# Patient Record
Sex: Male | Born: 1990 | Race: Black or African American | Hispanic: No | Marital: Single | Smoking: Former smoker
Health system: Southern US, Community
[De-identification: ages and names within clinical notes are randomized; demographics above are authoritative.]

## PROBLEM LIST (undated history)

## (undated) HISTORY — PX: COSMETIC SURGERY: SHX468

## (undated) HISTORY — PX: DENTAL SURGERY: SHX609

---
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
## 2014-02-25 ENCOUNTER — Emergency Department (HOSPITAL_COMMUNITY): Payer: Self-pay

## 2014-02-25 ENCOUNTER — Emergency Department (HOSPITAL_COMMUNITY)
Admission: EM | Admit: 2014-02-25 | Discharge: 2014-02-25 | Disposition: A | Discharge status: Home / Self Care | Payer: Self-pay | Attending: Emergency Medicine | Admitting: Emergency Medicine

## 2014-02-25 ENCOUNTER — Encounter (HOSPITAL_COMMUNITY): Payer: Self-pay | Admitting: Emergency Medicine

## 2014-02-25 DIAGNOSIS — Y9389 Activity, other specified: Secondary | ICD-10-CM | POA: Insufficient documentation

## 2014-02-25 DIAGNOSIS — Y92410 Unspecified street and highway as the place of occurrence of the external cause: Secondary | ICD-10-CM | POA: Insufficient documentation

## 2014-02-25 DIAGNOSIS — M545 Low back pain, unspecified: Secondary | ICD-10-CM

## 2014-02-25 DIAGNOSIS — S298XXA Other specified injuries of thorax, initial encounter: Secondary | ICD-10-CM | POA: Insufficient documentation

## 2014-02-25 DIAGNOSIS — F17200 Nicotine dependence, unspecified, uncomplicated: Secondary | ICD-10-CM | POA: Insufficient documentation

## 2014-02-25 DIAGNOSIS — IMO0002 Reserved for concepts with insufficient information to code with codable children: Secondary | ICD-10-CM | POA: Insufficient documentation

## 2014-02-25 DIAGNOSIS — M62830 Muscle spasm of back: Secondary | ICD-10-CM

## 2014-02-25 MED ORDER — CYCLOBENZAPRINE HCL 10 MG PO TABS: 10.0000 mg | ORAL_TABLET | Freq: Two times a day (BID) | ORAL | Status: DC | PRN

## 2014-02-25 MED ORDER — IBUPROFEN 800 MG PO TABS: 800.0000 mg | ORAL_TABLET | Freq: Three times a day (TID) | ORAL | Status: DC

## 2014-02-25 MED ORDER — IBUPROFEN 800 MG PO TABS
800.0000 mg | ORAL_TABLET | Freq: Once | ORAL | Status: AC
  Administered 2014-02-25: 800 mg via ORAL
  Filled 2014-02-25: qty 1
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
## 2014-04-03 NOTE — ED Notes (Signed)
Medical screening examination/treatment/procedure(s) were performed by non-physician practitioner and as supervising physician I was immediately available for consultation/collaboration.   EKG Interpretation None       Arby BarretteMarcy Makeba Delcastillo, MD 04/03/14 [phone]

## 2014-04-03 NOTE — ED Notes (Signed)
Medical screening examination/treatment/procedure(s) were performed by non-physician practitioner and as supervising physician I was immediately available for consultation/collaboration.   EKG Interpretation None       Arby BarretteMarcy Vadis Slabach, MD 04/03/14 [phone]

## 2016-12-24 ENCOUNTER — Emergency Department (HOSPITAL_COMMUNITY): Payer: BLUE CROSS/BLUE SHIELD

## 2016-12-24 ENCOUNTER — Emergency Department (HOSPITAL_COMMUNITY)
Admission: EM | Admit: 2016-12-24 | Discharge: 2016-12-24 | Disposition: A | Discharge status: Home / Self Care | Payer: BLUE CROSS/BLUE SHIELD | Attending: Emergency Medicine | Admitting: Emergency Medicine

## 2016-12-24 ENCOUNTER — Encounter (HOSPITAL_COMMUNITY): Payer: Self-pay | Admitting: Emergency Medicine

## 2016-12-24 DIAGNOSIS — R079 Chest pain, unspecified: Secondary | ICD-10-CM | POA: Diagnosis present

## 2016-12-24 DIAGNOSIS — F17210 Nicotine dependence, cigarettes, uncomplicated: Secondary | ICD-10-CM | POA: Diagnosis not present

## 2016-12-24 DIAGNOSIS — J9311 Primary spontaneous pneumothorax: Secondary | ICD-10-CM | POA: Diagnosis not present

## 2016-12-24 DIAGNOSIS — J939 Pneumothorax, unspecified: Secondary | ICD-10-CM

## 2016-12-24 LAB — CBC
HEMATOCRIT: 40.6 % (ref 39.0–52.0)
HEMOGLOBIN: 13.6 g/dL (ref 13.0–17.0)
MCH: 28 pg (ref 26.0–34.0)
MCHC: 33.5 g/dL (ref 30.0–36.0)
MCV: 83.7 fL (ref 78.0–100.0)
Platelets: 216 10*3/uL (ref 150–400)
RBC: 4.85 MIL/uL (ref 4.22–5.81)
RDW: 13.5 % (ref 11.5–15.5)
WBC: 8 10*3/uL (ref 4.0–10.5)

## 2016-12-24 LAB — I-STAT TROPONIN, ED: Troponin i, poc: 0.01 ng/mL (ref 0.00–0.08)

## 2016-12-24 LAB — BASIC METABOLIC PANEL
ANION GAP: 5 (ref 5–15)
BUN: 10 mg/dL (ref 6–20)
CALCIUM: 9.1 mg/dL (ref 8.9–10.3)
CHLORIDE: 107 mmol/L (ref 101–111)
CO2: 28 mmol/L (ref 22–32)
Creatinine, Ser: 0.94 mg/dL (ref 0.61–1.24)
GFR calc Af Amer: >60 mL/min (ref >60)
GFR calc non Af Amer: >60 mL/min (ref >60)
GLUCOSE: 92 mg/dL (ref 65–99)
POTASSIUM: 3.9 mmol/L (ref 3.5–5.1)
Sodium: 140 mmol/L (ref 135–145)

## 2016-12-24 NOTE — Consult Note (Signed)
Name: Martin Murray MRN: [id number] DOB: Jun 19, 1990    ADMISSION DATE:  12/24/2016 CONSULTATION DATE:  12/24/16  REFERRING MD :  Dr. 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    The patient reports he was recently playing basketball and set up for a screen when he was hit hard on the left side by another player.  Since that time, he has been hurting in his left chest.  He went to an urgent care 7/9 and was referred to the ER for symptoms.    Initial ER evaluation included a CXR which showed a small (less than 10%) pneumothorax.  He was treated with 100% NRB and observed in the ER. The patient reports improvement in symptoms with O2 administration.  Currently, he denies chest pain and SOB.  He states he has a follow up appointment scheduled tomorrow at 3pm with [facility].      PAST MEDICAL HISTORY :   has no past medical history on file.     has a past surgical history that includes Cosmetic surgery.  Prior to Admission medications   Medication Sig Start Date End Date Taking? Authorizing Provider  acetaminophen (TYLENOL) 500 MG tablet Take 1,000 mg by mouth every 6 (six) hours as needed for mild pain, moderate pain, fever or headache.   Yes [provider]  diphenhydramine-acetaminophen (TYLENOL PM) 25-500 MG TABS tablet Take 2 tablets by mouth at bedtime as needed (for sleep).   Yes [provider]    No Known Allergies  FAMILY HISTORY:  family history is not on file.  SOCIAL HISTORY:  reports that he has been smoking Cigarettes.  He has never used smokeless tobacco. He reports that he uses drugs, including Marijuana. He reports that he does not drink alcohol.  REVIEW OF SYSTEMS:  POSITIVES IN BOLD Constitutional: Negative for fever, chills, weight loss, malaise/fatigue and diaphoresis.  HENT: Negative for hearing loss, ear  pain, nosebleeds, congestion, sore throat, neck pain, tinnitus and ear discharge.   Eyes: Negative for blurred vision, double vision, photophobia, pain, discharge and redness.  Respiratory: Negative for cough, hemoptysis, sputum production, shortness of breath, wheezing and stridor.  Left sided pleuritic chest pain.  Cardiovascular: Negative for chest pain, palpitations, orthopnea, claudication, leg swelling and PND.  Gastrointestinal: Negative for heartburn, nausea, vomiting, abdominal pain, diarrhea, constipation, blood in stool and melena.  Genitourinary: Negative for dysuria, urgency, frequency, hematuria and flank pain.  Musculoskeletal: Negative for myalgias, back pain, joint pain and falls.  Skin: Negative for itching and rash.  Neurological: Negative for dizziness, tingling, tremors, sensory change, speech change, focal weakness, seizures, loss of consciousness, weakness and headaches.  Endo/Heme/Allergies: Negative for environmental allergies and polydipsia. Does not bruise/bleed easily.  SUBJECTIVE:   VITAL SIGNS: Pulse Rate:  [87] 87 (07/09 1116) Resp:  [18] 18 (07/09 1116) BP: (110)/(71) 110/71 (07/09 1116) SpO2:  [98 %] 98 % (07/09 1116) Weight:  [158 lb (71.7 kg)] 158 lb (71.7 kg) (07/09 1116)  PHYSICAL EXAMINATION: General: tall / thin young adult male sitting up in bed.  In good spirits / joking with staff.  HEENT: MM pink/moist, good dentition  PSY: calm/appropriate  Neuro: AAOx4, speech clear, MAE  CV: s1s2 rrr, no m/r/g PULM: even/non-labored, lungs bilaterally clear with good air movement  WU:JWJX, non-tender, bsx4 active  Extremities: warm/dry, no edema  Skin: no rashes or lesions on visible skin    Recent Labs Lab 12/24/16 1142  NA 140  K 3.9  CL 107  CO2 28  BUN 10  CREATININE 0.94  GLUCOSE 92     Recent Labs Lab 12/24/16 1142  HGB 13.6  HCT 40.6  WBC 8.0  PLT 216    Dg Chest 1 View  Result Date: 12/24/2016 CLINICAL DATA:  Left-sided  chest pain EXAM: CHEST 1 VIEW COMPARISON:  Earlier today FINDINGS: Confirmed small left apical pneumothorax, less than 10%. No convincing change from prior when allowing for differences in inspiration. No underlying lung opacity is seen. Normal heart size and mediastinal contours. No osseous findings. IMPRESSION: Confirmed left apical pneumothorax, less than 10%. Electronically Signed   By: Marnee SpringJonathon  Watts M.D.   On: 12/24/2016 13:27   Dg Chest 2 View  Result Date: 12/24/2016 CLINICAL DATA:  Chest pain with cough and chest congestion. EXAM: CHEST  2 VIEW COMPARISON:  02/25/2014 FINDINGS: There is suggestion of a tiny left apical pneumothorax. This is not definitive. I recommend a PA expiratory chest x-ray for further evaluation. The lungs are otherwise clear. Heart size and vascularity are normal. Bones are normal. No effusions. IMPRESSION: Possible tiny left apical pneumothorax. Expiratory PA chest x-ray recommended for further evaluation. Electronically Signed   By: Francene BoyersJames  Maxwell M.D.   On: 12/24/2016 12:20      SIGNIFICANT EVENTS  7/09  Seen in ER with spontaneous PTX  STUDIES:  CXR's as above    ASSESSMENT / PLAN:  Discussion: 26 y/o M seen on 7/9 with complaints of pleuritic left chest pain in the setting of a small (<10%) pneumothorax.  Potential recent trauma while playing basketball.  No family hx of hereditary pulmonary diseases.  Symptoms improved with administration of 100% NRB.  No change in follow / repeat film.     Left Pneumothorax   Plan: Agree that patient can discharge home with follow up with [facility] as planned for CXR at 3pm 7/10 Reviewed and contracted with patient that he would return to the ER immediately if new or worsening symptoms to include - chest pain, shortness of breath, pain with inspiration, pre-syncope / syncope Also, asked patient to stay with family overnight or have someone come stay with him Would encourage limited strenuous physical activity until  repeat CXR on 7/10 Pt verbalizes understanding of above.   Discussed with ER attending MD   Canary BrimBrandi Ladell Bey, NP-C [facility] Pgr: [phone] or if no answer [phone] 12/24/2016, 2:44 PM

## 2016-12-24 NOTE — Discharge Instructions (Signed)
Follow-up with no Martin Murray tomorrow as planned. Return sooner for increasing shortness of breath or difficulty breathing.

## 2016-12-24 NOTE — ED Provider Notes (Signed)
[facility]-EMERGENCY DEPT Provider Note   CSN: [id number] Arrival date & time: 12/24/16  1112     History   Chief Complaint Chief Complaint  Patient presents with  . Chest Pain    HPI Martin Murray is a 26 y.o. male.  HPI Patient resents with chest pain. Began yesterday while playing [employer]. Dull in his mid chest. Worse with certain movements. Worse with lying back somewhat better sitting up. Has some shortness of breath. States it comes and goes. No fevers. No swelling in his legs. Was seen at an urgent care and told him it may be muscle skeletal metastases and so severe she come to the ER. Father does have heart failure but does not know when it started. History reviewed. No pertinent past medical history.  There are no active problems to display for this patient.   Past Surgical History:  Procedure Laterality Date  . COSMETIC SURGERY     chin       Home Medications    Prior to Admission medications   Medication Sig Start Date End Date Taking? Authorizing Provider  acetaminophen (TYLENOL) 500 MG tablet Take 1,000 mg by mouth every 6 (six) hours as needed for mild pain, moderate pain, fever or headache.   Yes [provider]  diphenhydramine-acetaminophen (TYLENOL PM) 25-500 MG TABS tablet Take 2 tablets by mouth at bedtime as needed (for sleep).   Yes [provider]    Family History No family history on file.  Social History Social History  Substance Use Topics  . Smoking status: Current Every Day Smoker    Types: Cigarettes  . Smokeless tobacco: Never Used  . Alcohol use No     Allergies   Patient has no known allergies.   Review of Systems Review of Systems  Constitutional: Negative for appetite change.  HENT: Negative for congestion.   Respiratory: Positive for shortness of breath.   Cardiovascular: Positive for chest pain. Negative for leg swelling.  Gastrointestinal: Negative for abdominal pain.  Genitourinary: Negative for  flank pain.  Musculoskeletal: Negative for back pain.  Neurological: Negative for seizures.  Psychiatric/Behavioral: Negative for confusion.     Physical Exam Updated Vital Signs BP 122/69   Pulse 84   Temp 97.7 F (36.5 C)   Resp 16   Ht 6\' 2"  (1.88 m)   Wt 71.7 kg (158 lb)   SpO2 98%   BMI 20.29 kg/m   Physical Exam  Constitutional: He appears well-developed.  HENT:  Head: Normocephalic.  Eyes: Pupils are equal, round, and reactive to light.  Neck: Neck supple.  Cardiovascular: Exam reveals no friction rub.   No murmur heard. Tenderness to anterior lower chest wall. No rash. No crepitance or deformity.  Pulmonary/Chest: Effort normal.  Abdominal: Soft.  Musculoskeletal: He exhibits no edema.  Neurological: He is alert.  Skin: Skin is warm.     ED Treatments / Results  Labs (all labs ordered are listed, but only abnormal results are displayed) Labs Reviewed  BASIC METABOLIC PANEL  CBC  I-STAT TROPOININ, ED    EKG  EKG Interpretation  Date/Time:  Monday December 24 2016 11:20:15 EDT Ventricular Rate:  80 PR Interval:    QRS Duration: 91 QT Interval:  363 QTC Calculation: 419 R Axis:   85 Text Interpretation:  Sinus rhythm Right atrial enlargement RSR' in V1 or V2, probably normal variant Probable left ventricular hypertrophy Confirmed by Rubin Payor  MD, Harrold Donath [id number]) on 12/24/2016 12:38:55 PM Also confirmed by Rubin Payor  MD, Harrold DonathNATHAN [phone]), editor Misty StanleyScales-Price, Shannon [phone])  on 12/24/2016 1:04:29 PM       Radiology Dg Chest 1 View  Result Date: 12/24/2016 CLINICAL DATA:  Left-sided chest pain EXAM: CHEST 1 VIEW COMPARISON:  Earlier today FINDINGS: Confirmed small left apical pneumothorax, less than 10%. No convincing change from prior when allowing for differences in inspiration. No underlying lung opacity is seen. Normal heart size and mediastinal contours. No osseous findings. IMPRESSION: Confirmed left apical pneumothorax, less than 10%. Electronically  Signed   By: Marnee SpringJonathon  Watts M.D.   On: 12/24/2016 13:27   Dg Chest 2 View  Result Date: 12/24/2016 CLINICAL DATA:  Chest pain with cough and chest congestion. EXAM: CHEST  2 VIEW COMPARISON:  02/25/2014 FINDINGS: There is suggestion of a tiny left apical pneumothorax. This is not definitive. I recommend a PA expiratory chest x-ray for further evaluation. The lungs are otherwise clear. Heart size and vascularity are normal. Bones are normal. No effusions. IMPRESSION: Possible tiny left apical pneumothorax. Expiratory PA chest x-ray recommended for further evaluation. Electronically Signed   By: Francene BoyersJames  Maxwell M.D.   On: 12/24/2016 12:20    Procedures Procedures (including critical care time)  Medications Ordered in ED Medications - No data to display   Initial Impression / Assessment and Plan / ED Course  I have reviewed the triage vital signs and the nursing notes.  Pertinent labs & imaging results that were available during my care of the patient were reviewed by me and considered in my medical decision making (see chart for details).     Patient with acute shortness of breath. Began yesterday. Found to have small pneumothorax. Seen by pulmonary. Has had 2 hours of high flow oxygen. Stable restaurant status. Has follow-up with no Benjamine MolaVann tomorrow. Will discharge home.  Final Clinical Impressions(s) / ED Diagnoses   Final diagnoses:  Primary spontaneous pneumothorax    New Prescriptions New Prescriptions   No medications on file     Benjiman CorePickering, Demica Zook, MD 12/24/16 [id number]

## 2016-12-24 NOTE — ED Triage Notes (Signed)
patietn c/o left sided chest pain that has been hurting for coupe days. Patient went to PCP this morning and was told sounded muscular but went home and sharps pains were intermittent again so PCP when called told to go to ED. Patient reports feels like really bad heart burn. Patient reports when he was playing basketball yesterday he shot and pain started then couldn't play anymore.

## 2017-11-12 IMAGING — CR DG CHEST 2V
2 series · 2 of 2 positions shown · non-contrast
Comparison: 02/25/2014

CLINICAL DATA: Chest pain with cough and chest congestion.

EXAM:
CHEST  2 VIEW

[w chest pa]
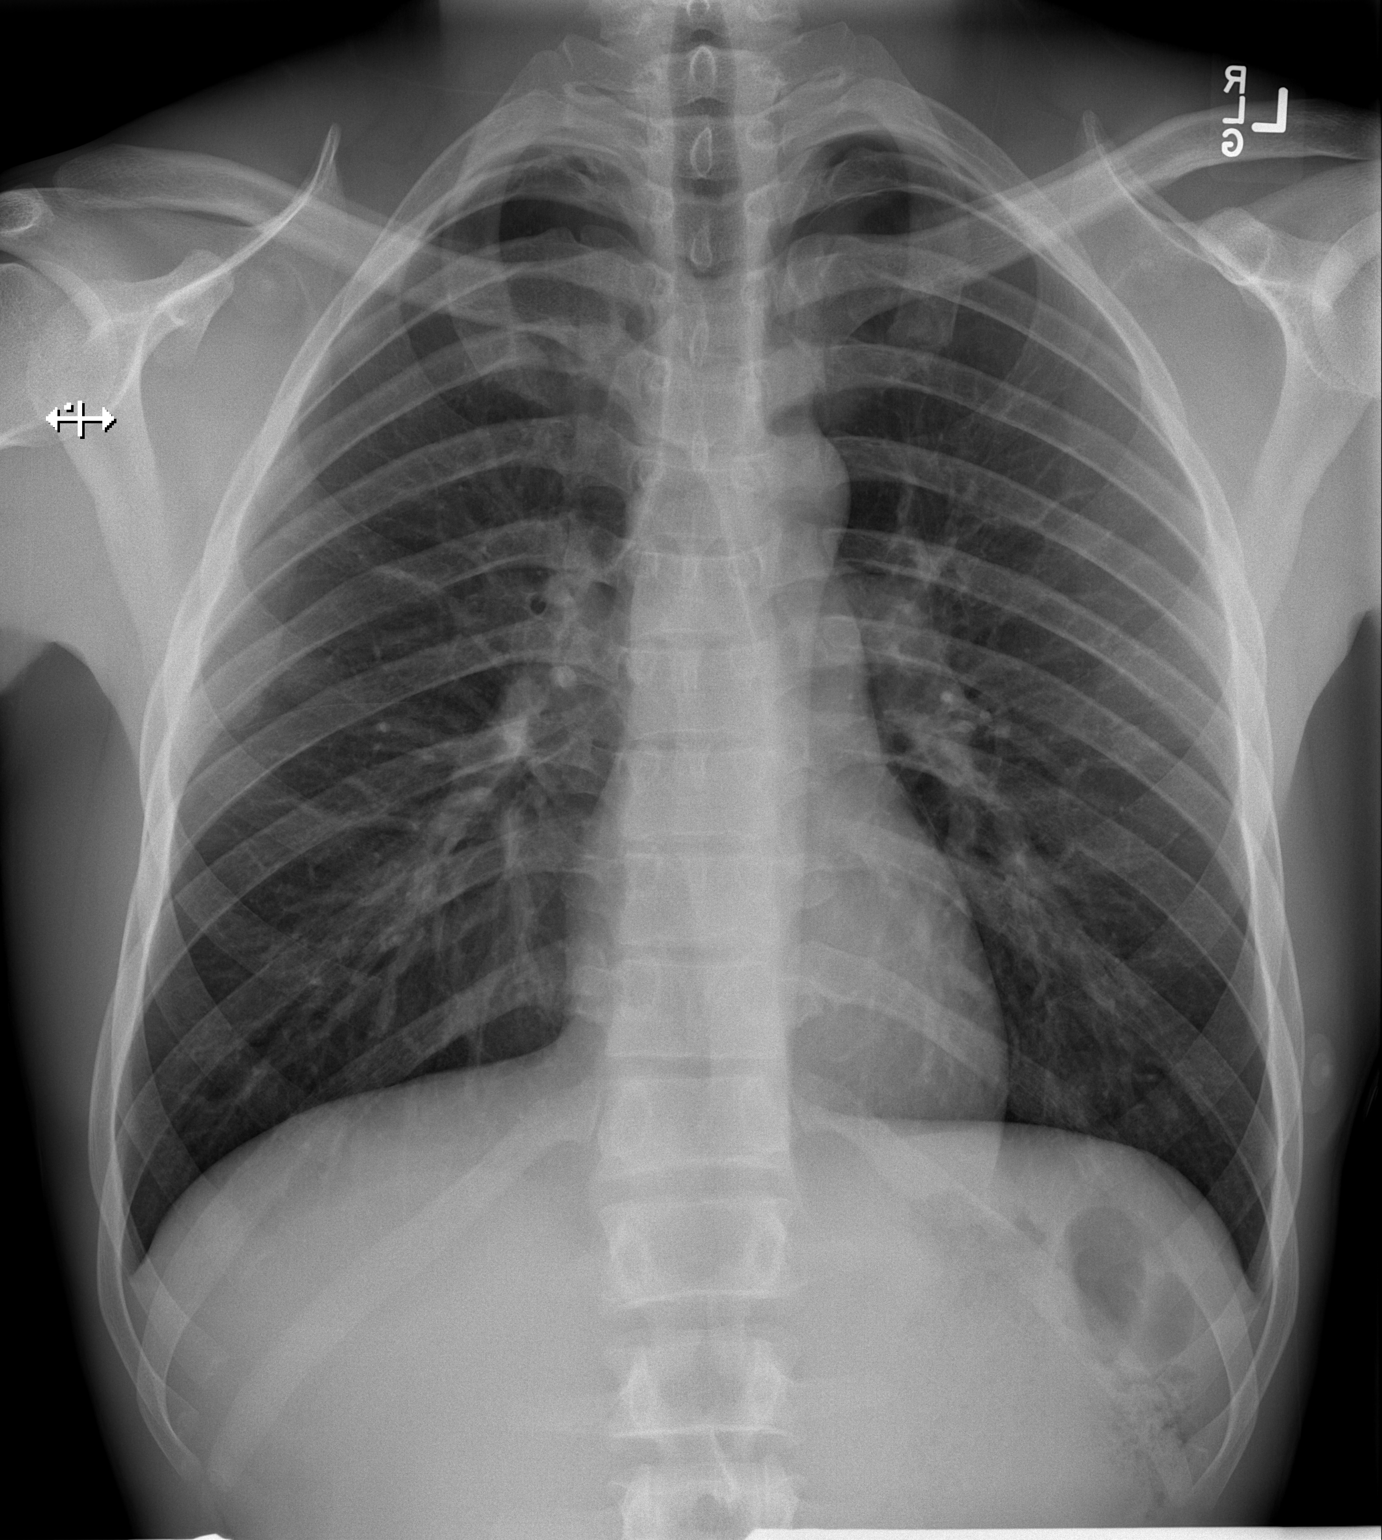

[w chest lat]
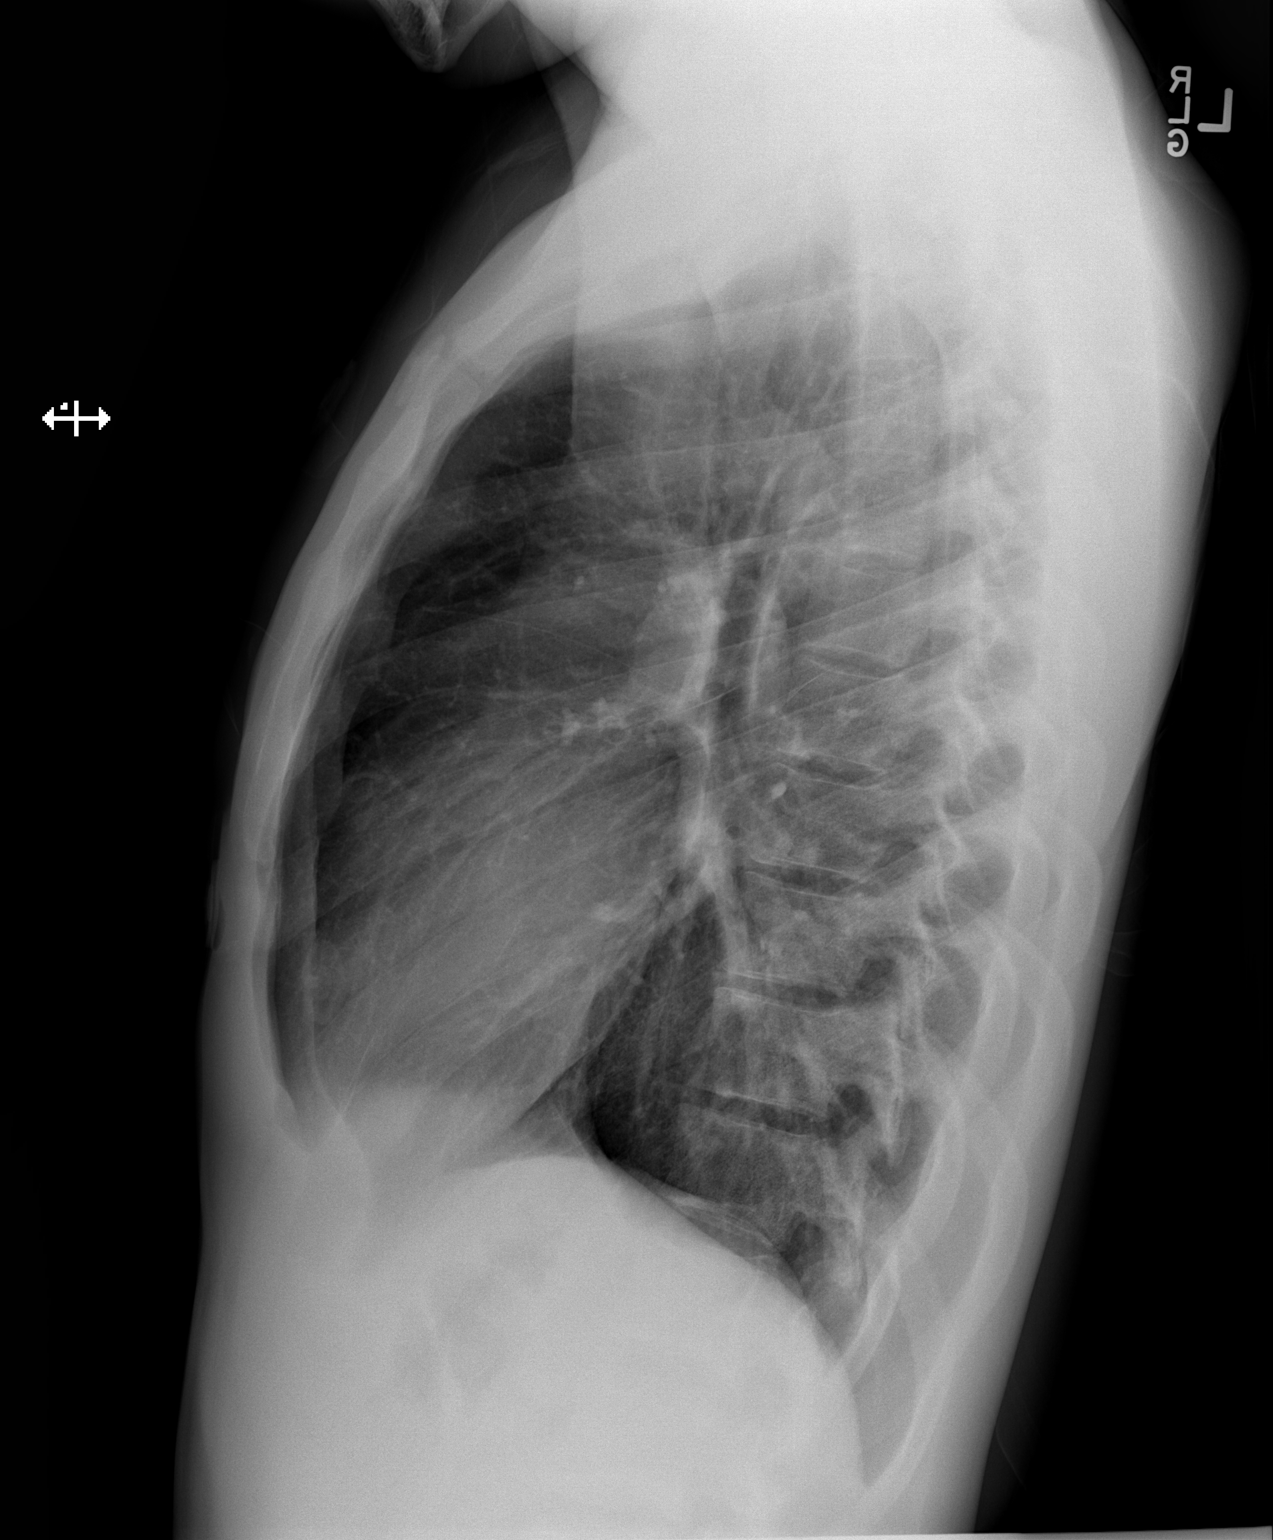

[2 of 2 positions shown; findings below may reference images not displayed]

FINDINGS: There is suggestion of a tiny left apical pneumothorax. This is not
definitive. I recommend a PA expiratory chest x-ray for further
evaluation. The lungs are otherwise clear. Heart size and
vascularity are normal. Bones are normal. No effusions.
IMPRESSION: Possible tiny left apical pneumothorax. Expiratory PA chest x-ray
recommended for further evaluation.

## 2017-11-12 IMAGING — CR DG CHEST 1V
1 series · 1 of 1 positions shown · non-contrast
Comparison: Earlier today

CLINICAL DATA: Left-sided chest pain

EXAM:
CHEST 1 VIEW

[w chest pa]
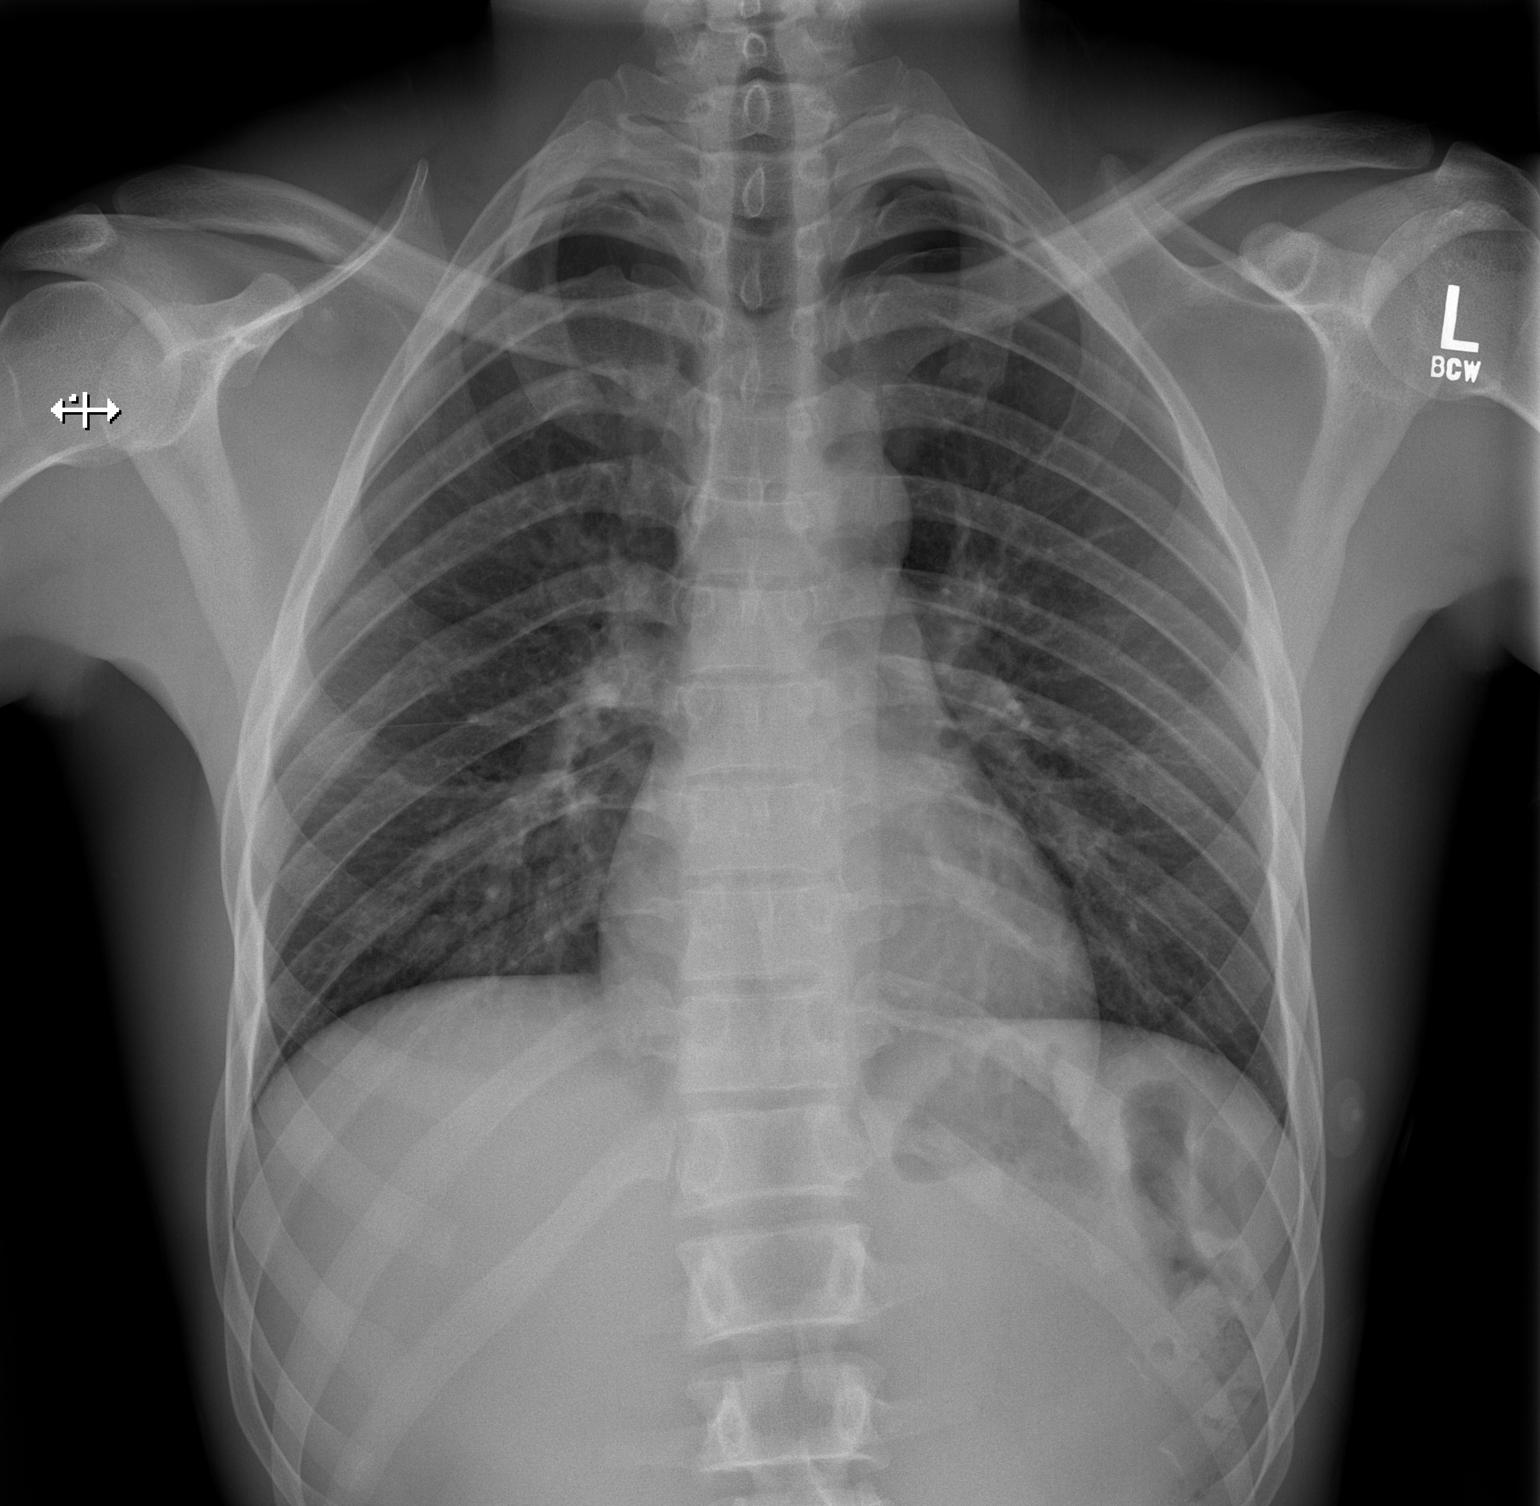

[1 of 1 positions shown; findings below may reference images not displayed]

FINDINGS: Confirmed small left apical pneumothorax, less than 10%. No
convincing change from prior when allowing for differences in
inspiration. No underlying lung opacity is seen. Normal heart size
and mediastinal contours. No osseous findings.
IMPRESSION: Confirmed left apical pneumothorax, less than 10%.

## 2018-11-16 ENCOUNTER — Other Ambulatory Visit: Payer: Self-pay

## 2018-11-16 ENCOUNTER — Emergency Department (HOSPITAL_COMMUNITY)
Admission: EM | Admit: 2018-11-16 | Discharge: 2018-11-16 | Disposition: A | Discharge status: Home / Self Care | Payer: BLUE CROSS/BLUE SHIELD | Attending: Emergency Medicine | Admitting: Emergency Medicine

## 2018-11-16 ENCOUNTER — Encounter (HOSPITAL_COMMUNITY): Payer: Self-pay | Admitting: *Deleted

## 2018-11-16 DIAGNOSIS — Z87891 Personal history of nicotine dependence: Secondary | ICD-10-CM | POA: Insufficient documentation

## 2018-11-16 DIAGNOSIS — L03213 Periorbital cellulitis: Secondary | ICD-10-CM | POA: Diagnosis not present

## 2018-11-16 DIAGNOSIS — H5712 Ocular pain, left eye: Secondary | ICD-10-CM | POA: Diagnosis present

## 2018-11-16 MED ORDER — CEPHALEXIN 500 MG PO CAPS: 500.0000 mg | ORAL_CAPSULE | Freq: Four times a day (QID) | ORAL | 0 refills | Status: AC

## 2018-11-16 MED ORDER — ERYTHROMYCIN 5 MG/GM OP OINT
TOPICAL_OINTMENT | Freq: Once | OPHTHALMIC | Status: AC
  Administered 2018-11-16: 1 via OPHTHALMIC
  Filled 2018-11-16: qty 3.5

## 2018-11-16 MED ORDER — CEPHALEXIN 500 MG PO CAPS: 500.0000 mg | ORAL_CAPSULE | Freq: Four times a day (QID) | ORAL | 0 refills | Status: DC

## 2018-11-16 MED ORDER — ERYTHROMYCIN 5 MG/GM OP OINT: TOPICAL_OINTMENT | OPHTHALMIC | 0 refills | Status: DC

## 2018-11-16 NOTE — ED Notes (Signed)
ED Provider at bedside. 

## 2018-11-16 NOTE — ED Triage Notes (Signed)
Pt has swollen left eye, Friday was painful, today swollen almost shut.

## 2018-11-16 NOTE — Discharge Instructions (Signed)
Keflex and erythromycin ointment as prescribed.  Return to the emergency department for worsening swelling, difficulty with your vision, or other new and concerning symptoms.

## 2018-11-16 NOTE — ED Provider Notes (Signed)
[facility]-EMERGENCY DEPT Provider Note   CSN: [id number] Arrival date & time: 11/16/18  1236    History   Chief Complaint Chief Complaint  Patient presents with  . Eye Pain    HPI Martin Murray is a 28 y.o. male.     Patient is a 28 year old male with no significant past medical history.  He presents with complaints of left eye pain and swelling.  He started with what he thought was a stye 2 days ago.  He attempted to squeeze it, now his eyelid is swollen, red, and painful.  He has been applying warm compresses with little relief.  Patient's vision is not affected.  The history is provided by the patient.  Eye Pain  This is a new problem. The current episode started 2 days ago. The problem occurs constantly. The problem has been rapidly worsening. Nothing aggravates the symptoms. Nothing relieves the symptoms. He has tried nothing for the symptoms.    History reviewed. No pertinent past medical history.  There are no active problems to display for this patient.   Past Surgical History:  Procedure Laterality Date  . COSMETIC SURGERY     chin  . DENTAL SURGERY          Home Medications    Prior to Admission medications   Medication Sig Start Date End Date Taking? Authorizing Provider  acetaminophen (TYLENOL) 500 MG tablet Take 1,000 mg by mouth every 6 (six) hours as needed for mild pain, moderate pain, fever or headache.    [provider]  diphenhydramine-acetaminophen (TYLENOL PM) 25-500 MG TABS tablet Take 2 tablets by mouth at bedtime as needed (for sleep).    [provider]    Family History No family history on file.  Social History Social History   Tobacco Use  . Smoking status: Former Smoker    Types: Cigarettes    Last attempt to quit: 08/16/2018    Years since quitting: 0.2  . Smokeless tobacco: Never Used  Substance Use Topics  . Alcohol use: No  . Drug use: Yes    Types: Marijuana      Allergies   Patient has no known allergies.   Review of Systems Review of Systems  Eyes: Positive for pain.  All other systems reviewed and are negative.    Physical Exam Updated Vital Signs BP 129/86 (BP Location: Left Arm)   Pulse 93   Temp 98.3 F (36.8 C) (Oral)   Resp 18   Ht 6\' 2"  (1.88 m)   Wt 73 kg   SpO2 98%   BMI 20.67 kg/m   Physical Exam Vitals signs and nursing note reviewed.  Constitutional:      Appearance: Normal appearance.  HENT:     Head: Normocephalic and atraumatic.  Eyes:     Extraocular Movements: Extraocular movements intact.     Pupils: Pupils are equal, round, and reactive to light.     Comments: The left upper eyelid is swollen and erythematous.  There is erythema extending into the lower eyelid.  There is some conjunctival injection and discharge.  Pulmonary:     Effort: Pulmonary effort is normal.  Neurological:     General: No focal deficit present.     Mental Status: He is alert.      ED Treatments / Results  Labs (all labs ordered are listed, but only abnormal results are displayed) Labs Reviewed - No data to display  EKG None  Radiology No results  found.  Procedures Procedures (including critical care time)  Medications Ordered in ED Medications - No data to display   Initial Impression / Assessment and Plan / ED Course  I have reviewed the triage vital signs and the nursing notes.  Pertinent labs & imaging results that were available during my care of the patient were reviewed by me and considered in my medical decision making (see chart for details).  This appears to be a periorbital cellulitis possibly extending from a stye.  He will be treated with Keflex, erythromycin ointment, and warm compresses.  He is to return as needed if he worsens.  Final Clinical Impressions(s) / ED Diagnoses   Final diagnoses:  None    ED Discharge Orders    None       Geoffery Lyonselo, Wendell Fiebig, MD 11/16/18 1327

## 2022-03-16 ENCOUNTER — Emergency Department (HOSPITAL_COMMUNITY): Payer: PRIVATE HEALTH INSURANCE

## 2022-03-16 ENCOUNTER — Emergency Department (HOSPITAL_COMMUNITY)
Admission: EM | Admit: 2022-03-16 | Discharge: 2022-03-16 | Discharge status: Against Medical Advice | Payer: PRIVATE HEALTH INSURANCE | Attending: Emergency Medicine | Admitting: Emergency Medicine

## 2022-03-16 ENCOUNTER — Other Ambulatory Visit: Payer: Self-pay

## 2022-03-16 DIAGNOSIS — N4400 Torsion of testis, unspecified: Secondary | ICD-10-CM | POA: Diagnosis not present

## 2022-03-16 DIAGNOSIS — N50812 Left testicular pain: Secondary | ICD-10-CM | POA: Diagnosis present

## 2022-03-16 DIAGNOSIS — Z5321 Procedure and treatment not carried out due to patient leaving prior to being seen by health care provider: Secondary | ICD-10-CM | POA: Insufficient documentation

## 2022-03-16 NOTE — ED Provider Triage Note (Signed)
Emergency Medicine Provider Triage Evaluation Note  Martin Murray , a 31 y.o. male  was evaluated in triage.  Pt complains of left testicle pain.  He has had intermittent pain and swelling in the left testicle.  He states he has a history of testicle pain and was hospitalized as a child but does not know what for.  He denies penile discharge and was recently tested for STIs and was negative..  Review of Systems  Positive: Testicle pain Negative: Penile discharge  Physical Exam  BP 118/89 (BP Location: Right Arm)   Pulse 72   Temp 98.9 F (37.2 C) (Oral)   Resp 16   SpO2 98%  Gen:   Awake, no distress   Resp:  Normal effort  MSK:   Moves extremities without difficulty  Other:  Left testicle pain  Medical Decision Making  Medically screening exam initiated at 3:11 PM.  Appropriate orders placed.  Martin Murray was informed that the remainder of the evaluation will be completed by another provider, this initial triage assessment does not replace that evaluation, and the importance of remaining in the ED until their evaluation is complete.  Work-up initiated   Martin Mail, PA-C 03/16/22 1512

## 2022-03-16 NOTE — ED Notes (Signed)
Pt no longer wanted to wait pt left hospital. 

## 2022-03-16 NOTE — ED Triage Notes (Signed)
Pt sent here from doctor to rule out testicular torsion. Pt c/o L testicular pain that s tared today around 10 am.
